# Patient Record
Sex: Male | Born: 1940 | Race: White | Hispanic: No | Marital: Married | Smoking: Never smoker
Health system: Southern US, Community
[De-identification: ages and names within clinical notes are randomized; demographics above are authoritative.]

## PROBLEM LIST (undated history)

## (undated) DIAGNOSIS — E119 Type 2 diabetes mellitus without complications: Secondary | ICD-10-CM

## (undated) DIAGNOSIS — I10 Essential (primary) hypertension: Secondary | ICD-10-CM

## (undated) DIAGNOSIS — I69354 Hemiplegia and hemiparesis following cerebral infarction affecting left non-dominant side: Secondary | ICD-10-CM

## (undated) DIAGNOSIS — S065X9A Traumatic subdural hemorrhage with loss of consciousness of unspecified duration, initial encounter: Secondary | ICD-10-CM

## (undated) DIAGNOSIS — I639 Cerebral infarction, unspecified: Secondary | ICD-10-CM

## (undated) DIAGNOSIS — R5381 Other malaise: Secondary | ICD-10-CM

## (undated) DIAGNOSIS — I96 Gangrene, not elsewhere classified: Secondary | ICD-10-CM

## (undated) DIAGNOSIS — I739 Peripheral vascular disease, unspecified: Secondary | ICD-10-CM

## (undated) DIAGNOSIS — K5900 Constipation, unspecified: Secondary | ICD-10-CM

## (undated) DIAGNOSIS — IMO0002 Reserved for concepts with insufficient information to code with codable children: Secondary | ICD-10-CM

## (undated) DIAGNOSIS — F0390 Unspecified dementia without behavioral disturbance: Secondary | ICD-10-CM

## (undated) DIAGNOSIS — R296 Repeated falls: Secondary | ICD-10-CM

## (undated) DIAGNOSIS — S42002A Fracture of unspecified part of left clavicle, initial encounter for closed fracture: Secondary | ICD-10-CM

## (undated) DIAGNOSIS — S065XAA Traumatic subdural hemorrhage with loss of consciousness status unknown, initial encounter: Secondary | ICD-10-CM

## (undated) DIAGNOSIS — I69391 Dysphagia following cerebral infarction: Secondary | ICD-10-CM

## (undated) DIAGNOSIS — E860 Dehydration: Secondary | ICD-10-CM

## (undated) HISTORY — DX: Traumatic subdural hemorrhage with loss of consciousness of unspecified duration, initial encounter: S06.5X9A

## (undated) HISTORY — DX: Fracture of unspecified part of left clavicle, initial encounter for closed fracture: S42.002A

## (undated) HISTORY — DX: Essential (primary) hypertension: I10

## (undated) HISTORY — DX: Hemiplegia and hemiparesis following cerebral infarction affecting left non-dominant side: I69.354

## (undated) HISTORY — DX: Cerebral infarction, unspecified: I63.9

## (undated) HISTORY — DX: Gangrene, not elsewhere classified: I96

## (undated) HISTORY — DX: Repeated falls: R29.6

## (undated) HISTORY — DX: Peripheral vascular disease, unspecified: I73.9

## (undated) HISTORY — DX: Type 2 diabetes mellitus without complications: E11.9

## (undated) HISTORY — DX: Dehydration: E86.0

## (undated) HISTORY — DX: Other malaise: R53.81

## (undated) HISTORY — DX: Dysphagia following cerebral infarction: I69.391

## (undated) HISTORY — DX: Constipation, unspecified: K59.00

## (undated) HISTORY — DX: Reserved for concepts with insufficient information to code with codable children: IMO0002

## (undated) HISTORY — DX: Traumatic subdural hemorrhage with loss of consciousness status unknown, initial encounter: S06.5XAA

## (undated) HISTORY — PX: CHOLECYSTECTOMY: SHX55

## (undated) HISTORY — DX: Unspecified dementia, unspecified severity, without behavioral disturbance, psychotic disturbance, mood disturbance, and anxiety: F03.90

---

## 2005-03-17 ENCOUNTER — Ambulatory Visit: Payer: Self-pay | Admitting: Physical Medicine & Rehabilitation

## 2005-03-17 ENCOUNTER — Inpatient Hospital Stay (HOSPITAL_COMMUNITY): Admission: EM | Admit: 2005-03-17 | Discharge: 2005-03-20 | Payer: Self-pay | Admitting: Emergency Medicine

## 2005-03-20 ENCOUNTER — Inpatient Hospital Stay (HOSPITAL_COMMUNITY)
Admission: RE | Admit: 2005-03-20 | Discharge: 2005-04-28 | Payer: Self-pay | Admitting: Physical Medicine & Rehabilitation

## 2006-09-06 IMAGING — CR DG CHEST 2V
2 series · 2 of 2 positions shown · non-contrast
Comparison: none

CLINICAL DATA: Brain hemorrhage.  Fever. 
 CHEST - TWO VIEW:
 AP and lateral views with comparison films of [DATE] reveal the heart size to be normal with dilatation of the aorta. Again, there is mild loss of volume in the bases without active findings.

[view not recorded (1 of 2)]
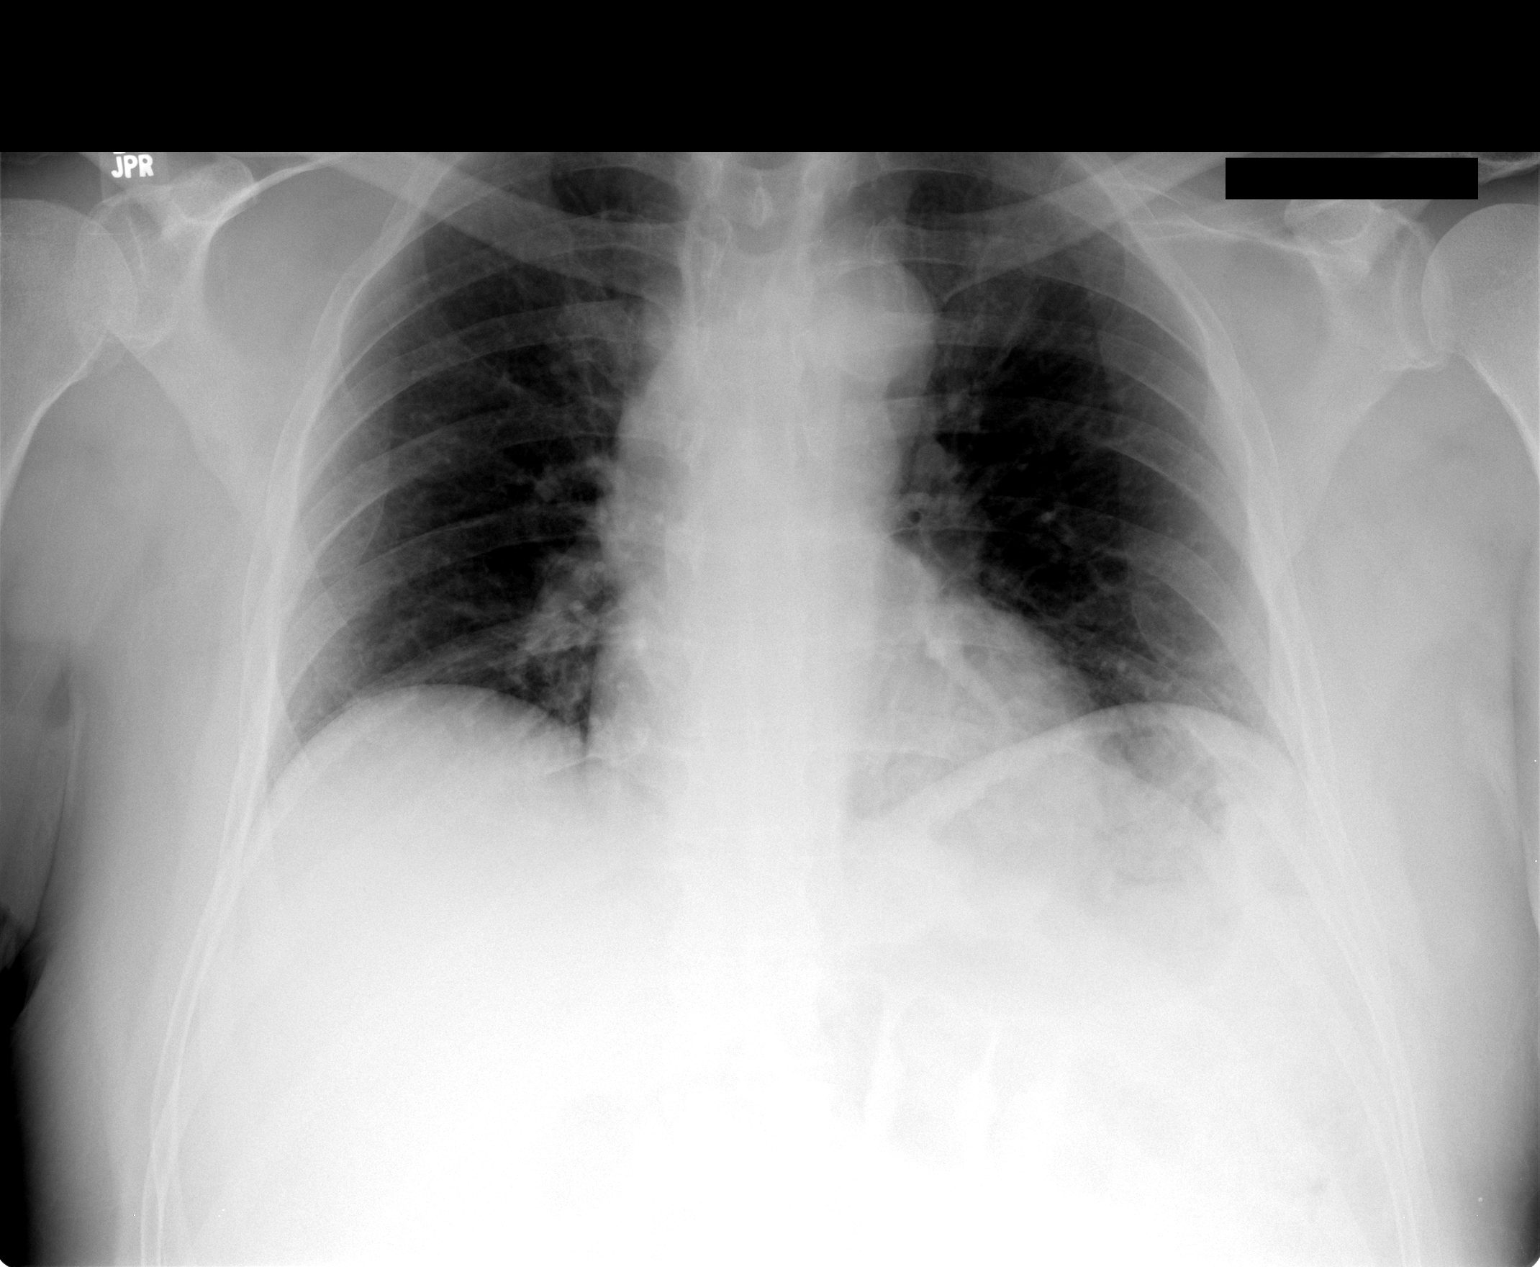

[view not recorded (2 of 2)]
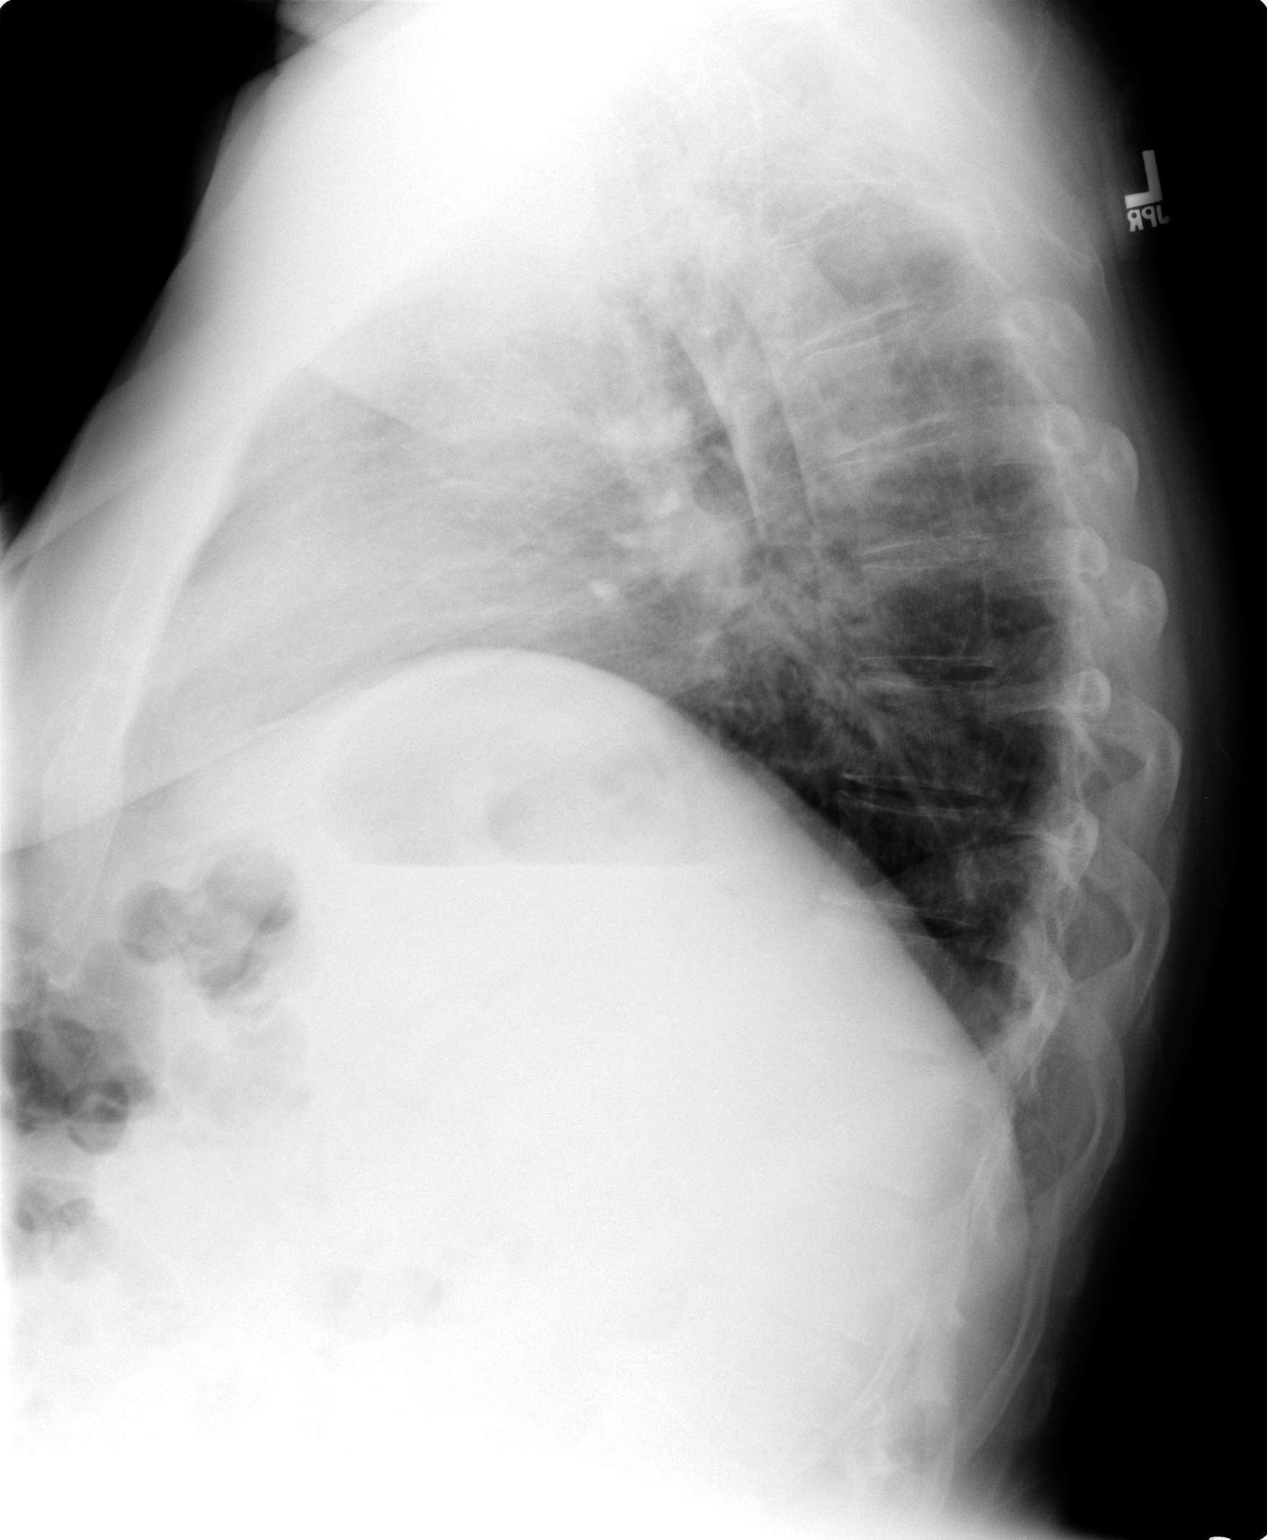

[2 of 2 positions shown; findings below may reference images not displayed]

IMPRESSION: No active disease.

## 2013-12-15 ENCOUNTER — Non-Acute Institutional Stay (SKILLED_NURSING_FACILITY): Payer: Medicare Other | Admitting: Adult Health

## 2013-12-15 ENCOUNTER — Encounter: Payer: Self-pay | Admitting: Adult Health

## 2013-12-15 DIAGNOSIS — I739 Peripheral vascular disease, unspecified: Secondary | ICD-10-CM

## 2013-12-15 DIAGNOSIS — Z5189 Encounter for other specified aftercare: Secondary | ICD-10-CM

## 2013-12-15 DIAGNOSIS — I10 Essential (primary) hypertension: Secondary | ICD-10-CM

## 2013-12-15 DIAGNOSIS — S065X9A Traumatic subdural hemorrhage with loss of consciousness of unspecified duration, initial encounter: Secondary | ICD-10-CM | POA: Insufficient documentation

## 2013-12-15 DIAGNOSIS — E1159 Type 2 diabetes mellitus with other circulatory complications: Secondary | ICD-10-CM | POA: Insufficient documentation

## 2013-12-15 DIAGNOSIS — I69354 Hemiplegia and hemiparesis following cerebral infarction affecting left non-dominant side: Secondary | ICD-10-CM | POA: Insufficient documentation

## 2013-12-15 DIAGNOSIS — I69959 Hemiplegia and hemiparesis following unspecified cerebrovascular disease affecting unspecified side: Secondary | ICD-10-CM

## 2013-12-15 DIAGNOSIS — G8929 Other chronic pain: Secondary | ICD-10-CM | POA: Insufficient documentation

## 2013-12-15 DIAGNOSIS — I96 Gangrene, not elsewhere classified: Secondary | ICD-10-CM | POA: Insufficient documentation

## 2013-12-15 MED ORDER — ACETAMINOPHEN 500 MG PO TABS: 1000.0000 mg | ORAL_TABLET | Freq: Two times a day (BID) | ORAL | Status: DC

## 2013-12-15 NOTE — Progress Notes (Signed)
Patient ID: Jack Ferguson, male   DOB: 1941/02/15, 72 y.o.   MRN: [id number]     [facility]  No Known Allergies  Chief Complaint  Patient presents with  . Hospitalization Follow-up    HPI Prior to his initial hospitalization in Nov 2014 he was living at home with his daughter with minimal asistance. He suffered a fall with a 3 cm subdural hematoma he was hospitalized had confusion and was taken to another skilled setting for rehab services. On Dec 5th he was discharged to home and fell within moments of being home causing a left clavicle fracture. He was not hospitalized at that time. Upon his MD follow up visit he was found to have not one but 2 toes with gangrene; severe constipation and his clavicle fracture. His pcp felt is was necessary for him to hospitalized at that time. Upon his admission to the hospital a repeat ct scan was performed and per his daughter he was found to have a second subdural hematoma which was felt to be 58-84 weeks old; which was not related to the known 2 falls.  He is at this facility for short term rehab; his goal is to return back home with his daughter at this time.  Today he is more confused did not eat either breakfast or lunch; he is more confused his speech is somewhat more slurred. He is having pain with movement. He cannot fully participate in the hpi or ros at this time.   Past Medical History  Diagnosis Date  . Gangrene of toe     right great toe and left 2nd toe  . Subdural hematoma, post-traumatic   . Hypertension   . PVD (peripheral vascular disease)   . Diabetes mellitus without complication   . Multiple falls   . Physical debility   . Constipation   . Stroke   . Hemiparesis affecting left side as late effect of cerebrovascular accident   . Fracture of left clavicle     Past Surgical History  Procedure Laterality Date  . Cholecystectomy      Filed Vitals:   12/15/13 1441  BP: 140/80  Pulse: 90  Height: 6\' 3"  (1.905 m)  Weight: 160  lb (72.576 kg)   MEDICATIONS  norvasc 5 mg daily  mvi daily Metformin 500 mg daily Fish oil 1gm daily miralax daily prn    SIGNIFICANT STUDIES:  11/2013: left foot x-ray: no evidence of osteomyelitis 11/2103: right foot x-ray: suspicious for osteomyelitis at the distal phalanx of the great toe 11/2013: left clavicle: non displace fracture 11/2013: ct of head: left sided subdural hematoma   LABS REVIEWED:  11/2013: glucose 139; bun 23; creat 0.77   Review of Systems  Unable to perform ROS    Physical Exam  Constitutional: No distress.  frail  Neck: Neck supple. No JVD present.  Cardiovascular: Normal rate and regular rhythm.   Pedal pulses very faint  Respiratory: Effort normal and breath sounds normal. No respiratory distress.  GI: Soft. Bowel sounds are normal. He exhibits no distension. There is no tenderness.  Musculoskeletal: He exhibits no edema.  Has lower extremity contractures present. Left hand is a splint; has full range of motion in right arm   Neurological: He is alert.  Speech is slurred; is aware of his surroundings. Does recognized others   Skin: Skin is warm and dry. He is not diaphoretic.  Right great toe and left 2nd toe black due to gangrene     ASSESSMENT/PLAN  1. Cva: no change in status; he is not a candidate for anticoagulation due to his subdural hematomas. Will continue therapy as directed and will continue to monitor his status  2. Subdural hematoma: no change in status; will continue to monitor her status  3. Left clavicle fracture: will set up an orthopedic consult and will monitor his status  4. Hypertension: will continue norvasc 5 mg daily and will monitor  5. Diabetes: will continue metformin 500 gm daily and daily cbg's will check hgb a1c  6. Poor  fluid intake: will begin NS at 75 cc per hour for 2 liters; in the am will check a stat cbc and cmp and will monitor his status will have him out of bed for meals in order to promote  his meal intake   7. Chronic pain: will begin tylenol 1 gm twice daily and will monitor   Time spent with patient 50 minutes

## 2013-12-16 ENCOUNTER — Non-Acute Institutional Stay (SKILLED_NURSING_FACILITY): Payer: Medicare Other | Admitting: Internal Medicine

## 2013-12-16 DIAGNOSIS — I70269 Atherosclerosis of native arteries of extremities with gangrene, unspecified extremity: Secondary | ICD-10-CM

## 2013-12-16 DIAGNOSIS — E1159 Type 2 diabetes mellitus with other circulatory complications: Secondary | ICD-10-CM

## 2013-12-16 DIAGNOSIS — S065X9A Traumatic subdural hemorrhage with loss of consciousness of unspecified duration, initial encounter: Secondary | ICD-10-CM

## 2013-12-16 DIAGNOSIS — E860 Dehydration: Secondary | ICD-10-CM

## 2013-12-16 DIAGNOSIS — I6200 Nontraumatic subdural hemorrhage, unspecified: Secondary | ICD-10-CM

## 2013-12-16 NOTE — Progress Notes (Signed)
Patient ID: Jack Ferguson, male   DOB: 05-Oct-1941, 72 y.o.   MRN: [id number]  Facility; [facility] Chief complaint; admission to SNF post admit to [facility] from December 11 to December 16  History; this is a severely disabled 72 year old man. He has a past history of a remote CVA with residual dense left hemiplegia.Marland Kitchen He has a history of multiple falls out of his wheelchair and was in hospital on November 1 secondary to a fall out of a wheelchair at which time he suffered a dense left frontal intraparenchymal hemorrhage. He was admitted on December of 11 secondary to bilateral dry gangrene in his feet his most recent CT scan confirms the presence of a left subdural hematoma with mass effect and shift. He was seen by neurosurgery who did not feel that he required acute neurosurgical intervention. Serial CT scans over the "next several months" were also suggested  On this occasion he apparently presented to hospital with gangrene of his toes bilaterally this includes Dr. Carnella Guadalajara gangrene of the right first toe. There was also dry gangrene with impending autoamputation of the left second toe. X-ray of the right first toe was suspicious for osteomyelitis. Amputation of the toes were suggested at some point however evaluation of the subdural hematoma seems to preclude this. He received IV Teflaro. But has not been started on any further antibiotics.  He was already found to be clinically dehydrated in the facility and has been on IV normal saline at 75 cc an hour overnight  Past Medical History  Diagnosis Date  . Gangrene of toe     right great toe and left 2nd toe  . Subdural hematoma, post-traumatic   . Hypertension   . PVD (peripheral vascular disease)   . Diabetes mellitus   . Multiple falls   . Physical debility   . Constipation   . Stroke   . Hemiparesis affecting left side as late effect of cerebrovascular accident   . Fracture of left clavicle    Past Surgical History   Procedure Laterality Date  . Cholecystectomy     Medication; amlodipine 5 mg a day, calcium carbonate 500 daily, fish oil at thousand daily, metformin 500 mg orally daily, MiraLax 17 g daily multivitamin  Social history; he has full code status. He was apparently at a nursing home in [place] before he came here. As noted had multiple falls out of his wheelchair  Family history not available from any current source   Review of systems; not possible  Physical examination Gen. very disabled man with bilateral lower extremity flexion contractures left arm flexion contracture. Older from gangrenous wounds in his feet Oral examination very difficult Forward appeared he had unswallowed food rather than thrush noted Lymph nonpalpable in the cervical chain. No thyroid palpable Respiratory clear entry bilaterally Cardiac heart sounds are tachycardic in the 110-120 range O2 sat 97% on room air Abdomen; no liver no spleen no tenderness GU bladder is not overtly distended or tender there is no CVA tenderness Extremities peripheral pulses are absent gangrenous left second toe and right first toe. His legs are contracture; he flexes them further due to pain probbly a combination of the fractures and his gangrene Neurologic; there is severe bihemispheric damage here. He seems able to respond to some questions including stating that he was 72 years old and that he was from [place]. Other times she just seemed to imitate what I was saying to him  Skin there  is an area on the left medial ankle which is probably an impending ischemic wounds  Impression/plan #1 bihemispheric damage apparently complicated by a recent large subdural hematoma. I have reviewed an MRI from the [facility] system in 2006 at which time he had a right thalamic intracerebral hemorrhage. 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 This man is having pain at rest. Apparently his responsible party will not allow more aggressive pain medication #3 dehydration; I agree is somewhat dehydrated at the bedside lab work is pending  #4 dry gangrene as described. I don't see any overt soft tissue extension of infection however he is at risk of this. She really requires bilateral probably at least below-knee amputations perhaps above-knee amputations.  It has already been est. That he is not a candidate for revascularization according to the notes that came with. He is probably already developing another wound over the left ankle. He is not an easy candidate for plavix secondary to the recent large subdural hematoma.  This is going to be a progressively difficult situation I think complicated by failure to thrive, declining oral intake secondary to bihemispheric damage and pain. I will late lab work from today. He has extreme physical and cognitive disability.

## 2014-01-16 ENCOUNTER — Non-Acute Institutional Stay (SKILLED_NURSING_FACILITY): Payer: Medicare Other | Admitting: Internal Medicine

## 2014-01-16 DIAGNOSIS — S065X9A Traumatic subdural hemorrhage with loss of consciousness of unspecified duration, initial encounter: Secondary | ICD-10-CM

## 2014-01-16 DIAGNOSIS — I739 Peripheral vascular disease, unspecified: Secondary | ICD-10-CM

## 2014-01-16 DIAGNOSIS — I10 Essential (primary) hypertension: Secondary | ICD-10-CM

## 2014-01-16 DIAGNOSIS — S065XAA Traumatic subdural hemorrhage with loss of consciousness status unknown, initial encounter: Secondary | ICD-10-CM

## 2014-01-16 DIAGNOSIS — I6200 Nontraumatic subdural hemorrhage, unspecified: Secondary | ICD-10-CM

## 2014-01-16 DIAGNOSIS — R1310 Dysphagia, unspecified: Secondary | ICD-10-CM

## 2014-01-19 ENCOUNTER — Encounter: Payer: Self-pay | Admitting: Family

## 2014-01-19 ENCOUNTER — Non-Acute Institutional Stay (SKILLED_NURSING_FACILITY): Payer: Medicare Other | Admitting: Family

## 2014-01-19 DIAGNOSIS — S065X9A Traumatic subdural hemorrhage with loss of consciousness of unspecified duration, initial encounter: Secondary | ICD-10-CM

## 2014-01-19 DIAGNOSIS — S065XAA Traumatic subdural hemorrhage with loss of consciousness status unknown, initial encounter: Secondary | ICD-10-CM

## 2014-01-19 DIAGNOSIS — E871 Hypo-osmolality and hyponatremia: Secondary | ICD-10-CM

## 2014-01-19 DIAGNOSIS — E1159 Type 2 diabetes mellitus with other circulatory complications: Secondary | ICD-10-CM

## 2014-01-19 DIAGNOSIS — I6200 Nontraumatic subdural hemorrhage, unspecified: Secondary | ICD-10-CM

## 2014-01-19 DIAGNOSIS — I69354 Hemiplegia and hemiparesis following cerebral infarction affecting left non-dominant side: Secondary | ICD-10-CM

## 2014-01-19 DIAGNOSIS — I69959 Hemiplegia and hemiparesis following unspecified cerebrovascular disease affecting unspecified side: Secondary | ICD-10-CM

## 2014-01-19 NOTE — Progress Notes (Signed)
   Patient ID: Jack Ferguson, male   DOB: 12/06/1941, 73 y.o.   MRN: [id number]  Date: 01/19/14 [facility]  Chief Complaint  Patient presents with  . Acute Visit    Abnormal Labs    HPI: Request form nurse to follow up with pt d/t abnormal lab values.  Health care team endorses inability to place condom cath on pt to monitor output.  No further issues/concerns expressed at present time.     No Known Allergies   Medication List       This list is accurate as of: 01/19/14 11:59 PM.  Always use your most recent med list.               acetaminophen 500 MG tablet  Commonly known as:  TYLENOL  Take 2 tablets (1,000 mg total) by mouth 2 (two) times daily.     amLODipine 5 MG tablet  Commonly known as:  NORVASC  Take 5 mg by mouth daily.     beta carotene w/minerals tablet  Take 1 tablet by mouth daily.     calcium carbonate 500 MG chewable tablet  Commonly known as:  TUMS - dosed in mg elemental calcium  Chew 1 tablet by mouth daily.     Fish Oil 1000 MG Caps  Take 1,000 mg by mouth daily.     metFORMIN 500 MG tablet  Commonly known as:  GLUCOPHAGE  Take 500 mg by mouth daily with breakfast.     polyethylene glycol packet  Commonly known as:  MIRALAX / GLYCOLAX  Take 17 g by mouth daily as needed.         DATA REVIEWED  Laboratory Studies: 01/17/14- Na 130, K 4.9, Cl 92, BUN 19, Creat. 0.5     Past Medical History  Diagnosis Date  . Gangrene of toe     right great toe and left 2nd toe  . Subdural hematoma, post-traumatic   . Hypertension   . PVD (peripheral vascular disease)   . Diabetes mellitus without complication   . Multiple falls   . Physical debility   . Constipation   . Stroke   . Hemiparesis affecting left side as late effect of cerebrovascular accident   . Fracture of left clavicle    Past Surgical History  Procedure Laterality Date  . Cholecystectomy      Review of Systems  Unable to perform ROS: medical condition      Physical Exam Filed Vitals:   01/19/14 1619  BP: 120/74  Pulse: 92  Temp: 98.6 F (37 C)  Resp: 20   There is no weight on file to calculate BMI. Physical Exam  Constitutional: He appears lethargic.  Cardiovascular: Normal rate and regular rhythm.   Pulmonary/Chest: Effort normal and breath sounds normal.  Abdominal:  Peg tube-receiving TF and H20 flushes  Neurological: He appears lethargic.  Unable to fully assess; pt moan/groans unable to effectively communicate    ASSESSMENT/PLAN NS a 75 cc/hour 12 hours Keep condom cath in place Measure I/O Repeat BMP next draw  Follow up:prn

## 2014-01-20 ENCOUNTER — Non-Acute Institutional Stay (SKILLED_NURSING_FACILITY): Payer: Medicare Other | Admitting: Internal Medicine

## 2014-01-20 DIAGNOSIS — S065XAA Traumatic subdural hemorrhage with loss of consciousness status unknown, initial encounter: Secondary | ICD-10-CM

## 2014-01-20 DIAGNOSIS — I6200 Nontraumatic subdural hemorrhage, unspecified: Secondary | ICD-10-CM

## 2014-01-20 DIAGNOSIS — I70269 Atherosclerosis of native arteries of extremities with gangrene, unspecified extremity: Secondary | ICD-10-CM

## 2014-01-20 DIAGNOSIS — S065X9A Traumatic subdural hemorrhage with loss of consciousness of unspecified duration, initial encounter: Secondary | ICD-10-CM

## 2014-01-20 NOTE — Progress Notes (Signed)
Patient ID: Jack Ferguson, male   DOB: 04/24/1941, 73 y.o.   MRN: [id number] [facility];; Chief complaint; review status post hospitalization at [facility] 12/23 through 1/16 History; this is a man that I admitted to the facility on 12/19 coming from [facility]. At that point in time he had bihemispheric damage complicated by a recent large subdural hematoma per his chronic left hemiparesis secondary to a remote right thalamic intracerebral hemorrhage. He also had dry gangrene and was already receiving IV fluids for progressive dehydration he presented to the hospital with altered mental status and was found that the subdural hematoma out of bed to a now chronic left-sided subdural with associated mass effect the. The patient was taken to the operating room after correction of a infection of unknown etiology possibly aspiration pneumonia. He ultimately really required a PEG tube  Physical examination Gen. the patient does not look particularly comfortable screams when you move him of all especially his lower extremities Respiratory shallow but otherwise clear entry Cardiac once again appears to be somewhat volume contracted Extremites; tight flexion contractures with progressive mummification of his right first and left second toe  Impression/plan #1 now status post Burhole of a large left subdural. Nevertheless the patient is left with severe cognitive and physical deficits #2 severe peripheral vascular disease with likely chronic pain.  #3 I will check his pacing metabolic panel next week for lab work didn't done in the facility did not suggest dehydration on 1/16

## 2014-01-23 ENCOUNTER — Encounter: Payer: Self-pay | Admitting: Family

## 2014-01-27 DIAGNOSIS — S065XAA Traumatic subdural hemorrhage with loss of consciousness status unknown, initial encounter: Secondary | ICD-10-CM | POA: Insufficient documentation

## 2014-01-27 DIAGNOSIS — R1310 Dysphagia, unspecified: Secondary | ICD-10-CM | POA: Insufficient documentation

## 2014-01-27 DIAGNOSIS — S065X9A Traumatic subdural hemorrhage with loss of consciousness of unspecified duration, initial encounter: Secondary | ICD-10-CM | POA: Insufficient documentation

## 2014-01-27 NOTE — Progress Notes (Signed)
        HISTORY & PHYSICAL  DATE: 01/16/2014   FACILITY[facility]  LEVEL OF CARE: SNF (31)  ALLERGIES:  No Known Allergies  CHIEF COMPLAINT:  Manage subdural hematoma, peripheral vascular disease and hypertension  HISTORY OF PRESENT ILLNESS: Patient is a 73 year old Caucasian male who was hospitalized with altered mental status. After hospitalization he is readmitted back to the facility for long-term care management.  SDH: Due to altered mental status he had a head CT done which showed increase in subcutaneous hematoma. He had a midline shift from left to right of his brain. He underwent drainage of the subdural hematoma with bur holes. Patient does not follow commands.  PVD: The patient was phoned in to have dry gangrene of his feet. Patient was deemed not a surgical candidate and conservative management was recommended.  HTN: Pt 's HTN remains stable.  Staff Deny CP, sob, DOE, pedal edema, headaches, dizziness or visual disturbances.  No complications from the medications currently being used.  Last BP : 150/81  PAST MEDICAL HISTORY :  Past Medical History  Diagnosis Date  . Gangrene of toe     right great toe and left 2nd toe  . Subdural hematoma, post-traumatic   . Hypertension   . PVD (peripheral vascular disease)   . Diabetes mellitus without complication   . Multiple falls   . Physical debility   . Constipation   . Stroke   . Hemiparesis affecting left side as late effect of cerebrovascular accident   . Fracture of left clavicle     PAST SURGICAL HISTORY: Past Surgical History  Procedure Laterality Date  . Cholecystectomy      SOCIAL HISTORY: Per record  reports that he has never smoked. He does not have any smokeless tobacco history on file.  FAMILY HISTORY: None Per record  CURRENT MEDICATIONS: [facility]  REVIEW OF SYSTEMS:  Unobtainable due to the patient being a poor historian  PHYSICAL EXAMINATION  VS:  T  98.6        P 92      RR 20       BP 152/81        WT (Lb) 152.2  GENERAL: no acute distress, normal body habitus EYES: conjunctivae normal, sclerae normal, normal eye lids MOUTH/THROAT: Unable to assess NECK: supple, trachea midline, no neck masses, no thyroid tenderness, no thyromegaly LYMPHATICS: no LAN in the neck, no supraclavicular LAN RESPIRATORY: breathing is even & unlabored, BS decreased CARDIAC: RRR, no murmur,no extra heart sounds, no edema GI:  ABDOMEN: abdomen soft, normal BS, no masses, no tenderness  LIVER/SPLEEN: no hepatomegaly, no splenomegaly MUSCULOSKELETAL: EXTREMITIES: Unable to assess, gangrene in left second toe and right great toe PSYCHIATRIC: the patient is alert & disoriented, decreased affect & behavior appropriate  LABS/RADIOLOGY:  12-14 CT of the abdomen: Fecal impaction, blood cultures showed no growth, urine culture negative, wbc 13, hemoglobin 12.2, platelets 342, right foot x-ray: No osteomyelitis  ASSESSMENT/PLAN:  Subdural hematoma-status post drainage. Continue supportive care PVD-patient has dry gangrene in bilateral feet. Consented to management recommended. Hypertension-blood pressure is elevated. Will monitor. Dysphagia-status post PEG tube placement Moderate protein calorie malnutrition-now on tube feedings Diabetes mellitus-continue metformin Check CBC and BMP  I have reviewed patient's medical records received at admission/from hospitalization.  CPT CODE: [id number]

## 2014-01-30 ENCOUNTER — Non-Acute Institutional Stay (SKILLED_NURSING_FACILITY): Payer: Medicare Other | Admitting: Internal Medicine

## 2014-01-30 DIAGNOSIS — N390 Urinary tract infection, site not specified: Secondary | ICD-10-CM

## 2014-01-30 NOTE — Progress Notes (Signed)
         PROGRESS NOTE  DATE: 01/30/2014  FACILITY:  [facility]  LEVEL OF CARE: SNF (31)  Acute Visit  CHIEF COMPLAINT:  Manage UTI  HISTORY OF PRESENT ILLNESS: I was requested by the staff to assess the patient regarding above problem(s):  On 01-23-14 patient's urinalysis shows positive nitrite, small leukosite esterase, small blood, WBC 7-10 and RBC 0-2. Urine culture grows pseudomonas aeruginosa significantly. Patient is a poor historian.  PAST MEDICAL HISTORY : Reviewed.  No changes.  CURRENT MEDICATIONS: [facility]  REVIEW OF SYSTEMS: Unobtainable due to patient being a poor historian  PHYSICAL EXAMINATION  GENERAL: no acute distress, normal body habitus RESPIRATORY: breathing is even & unlabored, BS CTAB CARDIAC: RRR, no murmur,no extra heart sounds, no edema GI: abdomen soft, normal BS, no masses, no tenderness, no hepatomegaly, no splenomegaly PSYCHIATRIC: the patient is alert & disoriented, affect depressed & behavior appropriate  LABS/RADIOLOGY: See history of present illness  ASSESSMENT/PLAN:  UTI-new problem. Start Cipro 500 mg twice a day for one week and probiotics twice a day for 10 days.  CPT CODE: [id number]

## 2014-01-31 ENCOUNTER — Non-Acute Institutional Stay (SKILLED_NURSING_FACILITY): Payer: Medicare Other | Admitting: Family

## 2014-01-31 ENCOUNTER — Encounter: Payer: Self-pay | Admitting: Family

## 2014-01-31 DIAGNOSIS — S065X9A Traumatic subdural hemorrhage with loss of consciousness of unspecified duration, initial encounter: Secondary | ICD-10-CM

## 2014-01-31 DIAGNOSIS — E871 Hypo-osmolality and hyponatremia: Secondary | ICD-10-CM

## 2014-01-31 DIAGNOSIS — I96 Gangrene, not elsewhere classified: Secondary | ICD-10-CM

## 2014-01-31 DIAGNOSIS — I69959 Hemiplegia and hemiparesis following unspecified cerebrovascular disease affecting unspecified side: Secondary | ICD-10-CM

## 2014-01-31 DIAGNOSIS — I69354 Hemiplegia and hemiparesis following cerebral infarction affecting left non-dominant side: Secondary | ICD-10-CM

## 2014-01-31 DIAGNOSIS — I6200 Nontraumatic subdural hemorrhage, unspecified: Secondary | ICD-10-CM

## 2014-01-31 DIAGNOSIS — S065XAA Traumatic subdural hemorrhage with loss of consciousness status unknown, initial encounter: Secondary | ICD-10-CM

## 2014-01-31 NOTE — Progress Notes (Signed)
Patient ID: Jack Ferguson, male   DOB: 01/31/1941, 73 y.o.   MRN: [id number]  Date: 01/31/14 [facility]  Code Status:  DNR  Chief Complaint  Patient presents with  . Acute Visit    Anxiety    HPI: Nurse notified NP of pt growing anxiety manifested by screaming for "Jack Ferguson" and interrupting with health care.  Pt reports feeling restless and down since placed in facility.  Pt reports calling for daughter, Jack Ferguson/Jack Ferguson, throughout the day.  Pt denies chest pain, palpitations, N/V/D, fever, chills; however, endorses sacral discomfort and foot pain.  Pt further endorses unwillingness to take pain medication. Pt reports positioning helps relieve pain and discomfort.  Pt endorses willingness to get out of bed and become more active.  No further issues/concerns at present time      No Known Allergies    Medication List       This list is accurate as of: 01/31/14  2:44 PM.  Always use your most recent med list.               acetaminophen 500 MG tablet  Commonly known as:  TYLENOL  Take 2 tablets (1,000 mg total) by mouth 2 (two) times daily.     amLODipine 5 MG tablet  Commonly known as:  NORVASC  Take 5 mg by mouth daily.     beta carotene w/minerals tablet  Take 1 tablet by mouth daily.     calcium carbonate 500 MG chewable tablet  Commonly known as:  TUMS - dosed in mg elemental calcium  Chew 1 tablet by mouth daily.     Fish Oil 1000 MG Caps  Take 1,000 mg by mouth daily.     metFORMIN 500 MG tablet  Commonly known as:  GLUCOPHAGE  Take 500 mg by mouth daily with breakfast.     polyethylene glycol packet  Commonly known as:  MIRALAX / GLYCOLAX  Take 17 g by mouth daily as needed.         DATA REVIEWED  Laboratory Studies: Reviewed     Past Medical History  Diagnosis Date  . Gangrene of toe     right great toe and left 2nd toe  . Subdural hematoma, post-traumatic   . Hypertension   . PVD (peripheral vascular disease)   . Diabetes mellitus  without complication   . Multiple falls   . Physical debility   . Constipation   . Stroke   . Hemiparesis affecting left side as late effect of cerebrovascular accident   . Fracture of left clavicle      Past Surgical History  Procedure Laterality Date  . Cholecystectomy      Review of Systems  Constitutional: Negative.   Respiratory: Negative.   Cardiovascular: Negative.   Gastrointestinal:       Peg infusing TF  Genitourinary:       Incontinent  Musculoskeletal:       Sacral Pain/Discomfort  Psychiatric/Behavioral: Positive for depression. Negative for suicidal ideas. The patient is nervous/anxious.      Physical Exam Filed Vitals:   01/31/14 1430  BP: 110/62  Pulse: 96  Temp: 97.5 F (36.4 C)  Resp: 20   There is no weight on file to calculate BMI. Physical Exam  Constitutional: He is oriented to person, place, and time.  Cardiovascular: Normal rate and regular rhythm.   Pulmonary/Chest: Effort normal and breath sounds normal.  Musculoskeletal:  Contractures to BLE-foot drop boot to BLE  Neurological: He  is alert and oriented to person, place, and time.  Intermittent confusion/restlessness noted/ L sided hemiparesis    ASSESSMENT/PLAN  Psych Eval to assess depression/ anxiety BMP on next draw Chlorhexidine 0.12% mouthwash bid po 15 ml  PT/OT eval/treat Treatment nurse place colloid dsg for prophylaxis   Follow up:prn

## 2014-02-06 ENCOUNTER — Non-Acute Institutional Stay (SKILLED_NURSING_FACILITY): Payer: Medicare Other | Admitting: Internal Medicine

## 2014-02-06 DIAGNOSIS — I10 Essential (primary) hypertension: Secondary | ICD-10-CM

## 2014-02-06 DIAGNOSIS — E1059 Type 1 diabetes mellitus with other circulatory complications: Secondary | ICD-10-CM

## 2014-02-06 DIAGNOSIS — I739 Peripheral vascular disease, unspecified: Secondary | ICD-10-CM

## 2014-02-06 DIAGNOSIS — I6990 Unspecified sequelae of unspecified cerebrovascular disease: Secondary | ICD-10-CM

## 2014-02-11 DIAGNOSIS — I6990 Unspecified sequelae of unspecified cerebrovascular disease: Secondary | ICD-10-CM | POA: Insufficient documentation

## 2014-02-11 DIAGNOSIS — E1059 Type 1 diabetes mellitus with other circulatory complications: Secondary | ICD-10-CM | POA: Insufficient documentation

## 2014-02-11 DIAGNOSIS — E1051 Type 1 diabetes mellitus with diabetic peripheral angiopathy without gangrene: Secondary | ICD-10-CM | POA: Insufficient documentation

## 2014-02-11 NOTE — Progress Notes (Signed)
        PROGRESS NOTE  DATE: 02/06/2014   FACILITY: [facility]  LEVEL OF CARE: SNF (31)  Discharge Visit  CHIEF COMPLAINT:  Manage CVA and hypertension  HISTORY OF PRESENT ILLNESS: I was requested by the social worker to perform face-to-face evaluation for discharge:  Patient was admitted to this facility for short-term rehabilitation after the patient's recent hospitalization.  Patient has completed SNF rehabilitation and therapy has cleared the patient for discharge on 01-30-14.  Reassessment of ongoing problem(s):  CVA: The patient's CVA remains stable.  Patient denies new neurologic symptoms such as numbness, tingling, weakness, speech difficulties or visual disturbances.  No complications reported from the medications currently being used. Patient has left-sided hemiparesis.  HTN: Pt 's HTN remains stable.  Denies CP, sob, DOE, pedal edema, headaches, dizziness or visual disturbances.  No complications from the medications currently being used.  Last BP : 101/75.  PAST MEDICAL HISTORY : Reviewed.  No changes.  [facility]: [facility]  REVIEW OF SYSTEMS:  GENERAL: no change in appetite, no fatigue, no weight changes, no fever, chills or weakness RESPIRATORY: no cough, SOB, DOE, wheezing, hemoptysis CARDIAC: no chest pain, edema or palpitations GI: no abdominal pain, diarrhea, constipation, heart burn, nausea or vomiting  PHYSICAL EXAMINATION  VS:  T 98.2       P 81       RR 16     BP 101/ 75        WT (Lb) 146.2  GENERAL: no acute distress, normal body habitus EYES: conjunctivae normal, sclerae normal, normal eye lids NECK: supple, trachea midline, no neck masses, no thyroid tenderness, no thyromegaly LYMPHATICS: no LAN in the neck, no supraclavicular LAN RESPIRATORY: breathing is even & unlabored, BS CTAB CARDIAC: RRR, no murmur,no extra heart sounds, no edema GI: abdomen soft, normal BS, no masses, no tenderness, no  hepatomegaly, no splenomegaly, patient has peg PSYCHIATRIC: the patient is alert & oriented to person, affect & behavior appropriate  LABS/RADIOLOGY:  02-02-14 glucose 154 otherwise BMP normal  ASSESSMENT/PLAN:  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 Not a surgical candidate.  I have filled out patient's discharge paperwork and written prescriptions.  Patient will receive home health PT, OT, nursing. DME provided: hoyer lift (V58.74, 728.87, CVA)  Total discharge time: Greater than 30 minutes Discharge time involved coordination of the discharge process with Child psychotherapistsocial worker, nursing staff and therapy department. Medical justification for home health services/DME verified.  CPT CODE: [id number]

## 2014-02-16 ENCOUNTER — Encounter: Payer: Self-pay | Admitting: *Deleted

## 2014-02-27 ENCOUNTER — Non-Acute Institutional Stay (SKILLED_NURSING_FACILITY): Payer: Medicare Other | Admitting: Internal Medicine

## 2014-02-27 ENCOUNTER — Encounter: Payer: Self-pay | Admitting: Internal Medicine

## 2014-02-27 DIAGNOSIS — I69354 Hemiplegia and hemiparesis following cerebral infarction affecting left non-dominant side: Secondary | ICD-10-CM

## 2014-02-27 DIAGNOSIS — I6200 Nontraumatic subdural hemorrhage, unspecified: Secondary | ICD-10-CM

## 2014-02-27 DIAGNOSIS — I96 Gangrene, not elsewhere classified: Secondary | ICD-10-CM

## 2014-02-27 DIAGNOSIS — E1159 Type 2 diabetes mellitus with other circulatory complications: Secondary | ICD-10-CM

## 2014-02-27 DIAGNOSIS — I69959 Hemiplegia and hemiparesis following unspecified cerebrovascular disease affecting unspecified side: Secondary | ICD-10-CM

## 2014-02-27 DIAGNOSIS — I10 Essential (primary) hypertension: Secondary | ICD-10-CM

## 2014-02-27 DIAGNOSIS — S065X9A Traumatic subdural hemorrhage with loss of consciousness of unspecified duration, initial encounter: Secondary | ICD-10-CM

## 2014-02-27 DIAGNOSIS — S065XAA Traumatic subdural hemorrhage with loss of consciousness status unknown, initial encounter: Secondary | ICD-10-CM

## 2014-02-27 NOTE — Progress Notes (Signed)
Patient ID: Jack Ferguson, male   DOB: [birth year], 73 y.o.   MRN: [id number]   This is an acute visit.  Level of care skilled.  [place].  Chief complaint-acute visit status post fall requiring ER visit-medical management of chronic conditions including subdural hematoma diabetes hypertension CVA late effect.  History of present illness.  Patient is a 73 year old male with the above diagnosis most acute issue was a fall last week apparently he hit his head there was concerns with his previous history of subdural hematoma.  He did go to the ER where workup apparently was fairly benign.  CT of the head showed a subacute subdural hematoma over the left convexity  that was actually decreased in size since the previous CT  .  Spine x-rays did not show any acute process.  Chest x-ray also did not show any acute process.  It appears as vital signs has been stable-.  In regards to patient's other conditions he is listed as a diabetic blood sugar so far this month had been largely in the low mid 100s it appears that one time he had been on Glucophage but I do not see per  [facility] that he is on this any more.  Blood pressures also appear to be fairly well controlled most recently 108/73-118/74.   he is on Norvasc 10 mg a day.  Patient also has clonidine as needed he does not appear though that this is needed very often.  I do know from the lab work done in the ER on February 26 his white count was elevated at 14.6 with neutrophils elevated slightly at 77.8 absolute neutrophils 11.4-we will need to recheck this-it appears he has not been afebrile were signs of any sepsis.  I do note in late January he was treated for a UTI with Cipro he had grown out Pseudomonas.  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  Medications have been reviewed per [facility]  Review of systems-essentially unobtainable--per nursing staff there has been no increased coughing shortness of breath complaints of dysuria fever chills apparently his pain is controlled on Tylenol as needed.  Physical exam.  Temperature is 97.4 pulse 96 respirations 18 blood pressure 108/73. Weight is 144.2   general this is a frail elderly male in no distress lying in bed.   skin is warm and dry does have bandaging to his feet bilaterally I do note dry gangrene of his right great toe and left second toe I do not note any increased erythema or edema here.  Eyes pupils appear reactive to light sclera and conjunctiva are clear.  Oropharynx clear mucous membranes moist.  Chest is clear to auscultation with poor respiratory effort no labored breathing.  Heart is regular rate and rhythm without murmur gallop or rub he does not have significant lower extremity edema.  Abdomen is soft nontender positive bowel sounds PEG site appears unremarkable.  Muscle skeletal continues with left-sided hemiparesis--and diffuse contractures witht contracture right hand fingers   neurologic continues with left-sided hemiparesis he does speak although somewhat tangential and limited.  Psych is oriented to self cannot really carry on a conversation.  .      .  Labs.  02/23/2014.  WBC 14.6 hemoglobin 12.3 platelets 350 NutraFil percent  77.8.  Sodium 134 potassium 4.4 BUN 24--creatinine 0.44 -liver function tests within normal limits except albumin of 2.7.   Assessment and plan.  #1-history CVA-subdural hematoma-patient appears to be at baseline CT in the ER actually showed some improvement in the subdural hematoma clinically he appears to be stable with previous exams continue to monitor--he remains PEG tube dependent.  #2-history diabetes type 2 blood sugars appear to be fairly well controlled here  usually in the 100s he is not on any agent will update a hemoglobin A1c.  #3-hypertension this appears to be relatively well-controlled he is on Norvasc--will update metabolic panel.  #4 leukocytosis-this lab was done most a week ago he does not show any signs of UTI or respiratory issue-will update a CBC with differential tomorrow.  Peripheral vascular disease-patient continues with a history of dry gangrene to his toes right great toe and left second toe-apparently patient has refused amputation in the past and family defers to his wishes-this is followed by wound care--at this point pain appears to be controlled on current medications-essentially Tylenol as needed apparently according to nursing staff he does not require this very often  [id number]-   .    .Marland Kitchen

## 2014-03-06 ENCOUNTER — Encounter: Payer: Self-pay | Admitting: Internal Medicine

## 2014-03-06 ENCOUNTER — Non-Acute Institutional Stay (SKILLED_NURSING_FACILITY): Payer: Medicare Other | Admitting: Internal Medicine

## 2014-03-06 DIAGNOSIS — E871 Hypo-osmolality and hyponatremia: Secondary | ICD-10-CM | POA: Insufficient documentation

## 2014-03-06 DIAGNOSIS — D72829 Elevated white blood cell count, unspecified: Secondary | ICD-10-CM | POA: Insufficient documentation

## 2014-03-06 NOTE — Progress Notes (Signed)
Patient ID: Jack Ferguson, male   DOB: 08/05/1941, 73 y.o.   MRN: [id number]   This is an acute visit.  Level of care skilled.  Facility [facility] .  Chief complaint-acute visit followup leukocytosis   History of present illness.  Patient is a 73 year old male with a history of CVA with left-sided hemiparesis status post PEG tube as well secondary to dysphasia-also has a history of subdural hematoma but related to multiple falls.  Patient had a fall about a week ago and went to the ER where workup was negative actually showed some improvement of the hematoma-blood work did show a somewhat elevated white count of14.6  with increased neutrophil percentage of 77%-we ordered updated lab work for later last weekthat shows a white count had come down slightly to 13.2 granulocyte percent was 74 actually grandes elevated at 9.8.  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  [facility]   Review of systems-essentially unobtainable--per nursing staff there has been no increased coughing shortness of breath complaints of dysuria fever chills apparently his pain is controlled on Tylenol as needed--today he is denying any of this as well says he has no cough fever or chills although he is a poor historian denies dysuria or stomach pain.  Marland Kitchen.  Physical exam.  Temperature 98.2 pulse 89 respirations 16 blood pressure 125/78  general this is a frail elderly male in no distress  lying in bed.  skin is warm and dry does have bandaging to his feet bilaterally I do note dry gangrene of his right great toe and left second toe I do not note any increased erythema or edema here--this is unchanged from last week. .  Oropharynx clear mucous membranes moist.  Chest is clear to auscultation with poor respiratory effort no labored breathing.  Heart is regular rate and rhythm without murmur gallop or rub he does not have significant lower extremity edema.  Abdomen is soft nontender positive bowel sounds PEG site appears unremarkable. There is no drainage or surrounding erythema  Muscle skeletal continues with left-sided hemiparesis--and diffuse contractures witht contracture right hand fingers  neurologic continues with left-sided hemiparesis he does speak although somewhat tangential and limited.  Psych is oriented to self cannot really carry on a conversation--somewhat irritated with exam but does allow it.  .  .  Labs 03/02/2014.  WBC 13.2 hemoglobin 13.1 platelets 335 granulocyte percentage high normal at 74 actually grans elevated at 9.8.  Sodium 133 potassium 4.5 BUN 14 creatinine 0.41.  Liver function tests within normal limits except albumin of 3.2   02/23/2014.  WBC 14.6 hemoglobin 12.3 platelets 350 NeutraFil percent 77.8.  Sodium 134 potassium 4.4 BUN 24--creatinine 0.44 -liver function tests within normal limits except albumin of 2.7 .  Assessment and plan.  #1--leukocytosis-this appears to have improved somewhat from last week he's  not show any overt signs of infection--cough shortness of breath  fever or chills or congestion or dysuria-nursing staff has not noted any of this either-will update CBC with differential in 2 weeks- this again this may be coming from the reaction to the gangrenous toes which is followed by the wound care physician and apparently is stable--patient has refused amputation area  #2-hyponatremia-this appears to be relatively mild and  chronic will update this in 2 weeks as well   [id number]  .

## 2014-03-22 ENCOUNTER — Non-Acute Institutional Stay (SKILLED_NURSING_FACILITY): Payer: Medicare Other | Admitting: Internal Medicine

## 2014-03-22 DIAGNOSIS — E1159 Type 2 diabetes mellitus with other circulatory complications: Secondary | ICD-10-CM

## 2014-03-22 DIAGNOSIS — I96 Gangrene, not elsewhere classified: Secondary | ICD-10-CM

## 2014-03-22 DIAGNOSIS — D72829 Elevated white blood cell count, unspecified: Secondary | ICD-10-CM

## 2014-03-22 DIAGNOSIS — I69354 Hemiplegia and hemiparesis following cerebral infarction affecting left non-dominant side: Secondary | ICD-10-CM

## 2014-03-22 DIAGNOSIS — R1310 Dysphagia, unspecified: Secondary | ICD-10-CM

## 2014-03-22 DIAGNOSIS — I69959 Hemiplegia and hemiparesis following unspecified cerebrovascular disease affecting unspecified side: Secondary | ICD-10-CM

## 2014-03-23 ENCOUNTER — Other Ambulatory Visit: Payer: Self-pay | Admitting: *Deleted

## 2014-03-23 MED ORDER — ALPRAZOLAM 0.25 MG PO TABS: ORAL_TABLET | ORAL | Status: AC

## 2014-03-23 NOTE — Telephone Encounter (Signed)
[facility]  

## 2014-03-24 ENCOUNTER — Non-Acute Institutional Stay (SKILLED_NURSING_FACILITY): Payer: Medicare Other | Admitting: Internal Medicine

## 2014-03-24 DIAGNOSIS — I96 Gangrene, not elsewhere classified: Secondary | ICD-10-CM

## 2014-03-24 DIAGNOSIS — D72829 Elevated white blood cell count, unspecified: Secondary | ICD-10-CM

## 2014-03-24 NOTE — Progress Notes (Signed)
Patient ID: Jack Ferguson, male   DOB: 03-19-1941, 73 y.o.   MRN: [id number]   This is an acute visit.  Level of care skilled.  Facility [facility]  .  Chief complaint-acute visit followup leukocytosis  History of present illness.  Patient is a 73 year old male with a history of CVA with left-sided hemiparesis status post PEG tube as well secondary to dysphasia-also has a history of subdural hematoma but related to multiple falls.     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 this is followed by wound care apparently he has refused amputation in the past-- Patient recently was in the hospital for PEG site complications and actually his PEG tube was removed he is now taking everything by mouth-I do not he had an elevated white count in the hospital -- he was seen by infectious disease and apparently determination was that he did not have osteomyelitis he did receive empiric antibiotics and was discharged on Keflex which he is completing per consultation in the hospital the best option was determined to be a high amputation-however apparently this was deferred for now apparently because of family desire  It appears in the hospital his white count varied from 14,000-to around 10,000 apparently was at the lower end on discharge.  Subsequent labs here have shown an elevated white count of 12-up to 14.5 thousand  He is a poor historian secondary to dementia but he is not complaining of any fever chills shortness of breath cough or dysuria-we did order a chest x-ray that has come back negative for anything acute urine also appears to be negative.  His PEG site also appears to be unremarkable with granulation tissue but I do not any sign of infection drainage bleeding or firmness around the site  He is followed by the wound care physician in the facility.  Family medical social history as been reviewed per previous progress notes.  Medications have been  reviewed.  Review of systems.  Limited secondary to dementia but as stated above he is not complaining of any fever or chills shortness of breath cough or dysuria does not complaining of abdominal pain has some complaints of lower extremity pain which are not increased from baseline  Physical exam.  Temperature is 97.1 pulse 85 respirations 16 blood pressure 107/63 O2 saturation 97% on room air  In general this is a frail elderly male in no distress lying comfortably in bed he is somewhat agitated with exam.  His skin is warm and dry I do note he has a gangrenous right great toe and left second toe this pretty much encompasses the entire toe. Heart is regular rate and rhythm without murmur gallop or rub.  His chest is clear to auscultation with no labored breathing.  Abdomen bowel sounds are positive -- where the PEG tube was removed there is granulation tissue I do not see any surrounding erythema drainage bleeding or  firmness around the site--there really is no significant increased tenderness when I palpate the area.  Labs.  03/17/2014.  WBC 14.5 hemoglobin 12.0 platelets 326 absolute granulocytes elevated at 11.3  Assessment and plan.  #1-leukocytosis-from clinical exam once would suspect this is coming mainly from the gangrenous toes-this has been discussed today with Dr. Leanord Hawking via phone--Will monitor.  I did speak with nursing staff and they have contacted the wound care physician who saw him today as well-apparently he is arranging a consult with the family about  care options-he has been made aware of the elevated white count  [id number]     .     .Marland Kitchen

## 2014-03-26 ENCOUNTER — Encounter: Payer: Self-pay | Admitting: Internal Medicine

## 2014-03-26 NOTE — Progress Notes (Signed)
Patient ID: Jack Ferguson, male   DOB: [birth year], 73 y.o.   MRN: [id number]   This is an acute visit.  Level of care skilled.  Facility [facility]  .  Chief complaint-acute visit status post hospitalization for malfunctioning PEG tube followup lower extremity gangrene.  History of present illness.  Patient is a 73 year old male with multiple medical issues-his PEG tube was malfunctioning and the facility he is status post CVA with dysphasia.  A PEG tube was assessed by the gastroenterologist and was discontinued he was reevaluated by speech therapy with a modified barium swallow-he seemed to be tolerating some by mouth intake.  Speech recommended mechanical soft diet.  The PEG tube was discontinued.  Apparently he tolerated the by mouth mechanical soft diet well in hospital.  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  Vascular surgeon also evaluated and concluded the same that a high amputation would be the best option.  Apparently this was discussed with his power of attorney his daughter.  Apparently it was determined this could be done at a later date and was deferred at time of hospitalization.  Infectious disease physician was also consulted for evaluation of the dry gangrene possible osteomyelitis and possible infection of the PEG site.  Patient initially was on broad-spectrum antibiotics-cultures from the PEG site did not reveal any specific organism. Patient was afebrile normotensive and leukocytosis apparently trended down.  Subsequently the IV antibiotics were discontinued and he was started on Keflex  And he has returned  to the facility.  He continues to be normotensive and afebrile it appears he continues to have a somewhat elevated white count as of today is 12.4-it had hovered as high as 14,000  previously  in facility--in the hospital it had been as high as 15,000 apparently on discharge was down to 10,000.   today patient has no acute complaints denies any fever chills does complain of pain at times apparently pain medication is helping  . Family medical social history as been reviewed most recently discharge summary on 03/20/2014  Medications have been reviewed.--Of note he does continue on Keflex  Review of systems.-Limited secondary to dementia.  Gen. is not complaining of fever chills.  Respiratory denies any cough or shortness of breath.  GI status post PEG tube which was removed he does not complaining of abdominal pain apparently he is tolerating his by mouth diet relatively well.  Muscle skeletal does have a history of dry gangrene and chronic pain which apparently is fairly well-controlled on current medications  Physical exam.  Temperature is 98.0 pulse 92 respirations 18 blood pressure 106/66.  In general this is a frail elderly male in no distress resting comfortably in bed however he is somewhat irritated with the exam.  Eyes pupils appear be reactive to light visual acuity appears grossly intact.  Chest is clear to auscultation no labored breathing.  Heart is regular rate and rhythm without murmur gallop or rub.  Abdomen is soft nontender with positive bowel sounds Peg site is covered this is followed by nursing and apparently looks fairly benign without sign of infection  Bowel sounds are positive.  Muscle skeletal-he does have contractures of the left lower extremity as well as the left arm--does have some movement of the arm.  He does have diffuse muscle atrophy.  Is able to move his right upper and lower  extremities.  He does have dry gangrene on the great toe on the right and left second toe this encompasses pretty much the entire toe.  I do not see any extensive erythema edema or drainage at this point  Labs.  Per hospital discharge  summary.  White count apparently was 10,000 on discharge hemoglobin 10.6 platelets 239.  Sodium 133 potassium 4 BUN 8 creatinine 0.56.  Assessment and plan.  #1 leukocytosis-this appears to vary between 10-15,000-patient had a fairly thorough valuation in hospital he is continuing on Keflex--will update a CBC with differential a.m. tomorrow and notify provider of results-there would be suspicion this is caused from the gangrene--- amputation  was discussed with family in the hospital per discharge note-he is also followed by the wound care physician in this facility and will be seen by him later this week--read  At this point appears to be clinically stable continue to monitor closely.  Also will obtain a urinalysis and culture as well as a chest x-ray to look for any other possible etiology  #2-diabetes-this appears to be diet-controlled CBG so far 99-109  #3 malfunctioning PEG tube--hx of dyshphagia-this has been removed apparently he is tolerating his by mouth diet fairly well continue to monitor.  #4-history CVA this appears to be at baseline.  #5-history of hypertension-this appears to be stable he is on Norvasc.  [id number]

## 2014-04-04 ENCOUNTER — Encounter: Payer: Self-pay | Admitting: Internal Medicine

## 2014-04-04 ENCOUNTER — Non-Acute Institutional Stay: Payer: Self-pay | Admitting: Internal Medicine

## 2014-04-04 DIAGNOSIS — I69354 Hemiplegia and hemiparesis following cerebral infarction affecting left non-dominant side: Secondary | ICD-10-CM

## 2014-04-04 DIAGNOSIS — R1310 Dysphagia, unspecified: Secondary | ICD-10-CM

## 2014-04-04 DIAGNOSIS — I96 Gangrene, not elsewhere classified: Secondary | ICD-10-CM

## 2014-04-04 DIAGNOSIS — I10 Essential (primary) hypertension: Secondary | ICD-10-CM

## 2014-04-04 NOTE — Assessment & Plan Note (Signed)
-  Continue Amlodipine daily

## 2014-04-04 NOTE — Progress Notes (Unsigned)
Patient ID: Jack Ferguson, male   DOB: [birth year], 73 y.o.   MRN: [id number]  Provider:  Gwenith Spitz. Renato Gails, D.O., C.M.D.  Location:  [facility]   PCP: No primary provider on file.  Code Status: FULL CODE  No Known Allergies  Chief Complaint  Patient presents with  . Hospitalization Follow-up    readmit s/p peg tube malfuction and discontinuation    HPI: 73 y.o. male with a history of severe PVD, dysphagia, hx of CVA, and DM II who is being readmitted to SNF from [facility].  Duration of hospitalization 03/18-03/23/2015.  He was admitted for PEG tube malfunctioning.  PEG tube was discontinued.  Speech therapy evaluated with barium swallow study.  Mechanical soft diet recommended and tolerated.  While in hospital Dr. Sondra Come with Vascular surgery and Dr. 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 Vascular states only treatment would be above knee amputation, that can be done at a later time.  Dr. Silvestre Mesi with Infectious diseases consulted as well.  Cultures of PEG site did not grow specific organisms; was treated with broad spectrum IV antibiotics which were transitioned to PO Keflex on discharge.  Palliative care consulted, spoke with daughter who is healthcare power of attorney; patient to still be full code. Plan is to follow-up with Dr. Sondra Come, Vascular surgery in 1-2 weeks; Gastroenterology in 1 week; Dr. Silvestre Mesi, Infectious diseases in 1-2 weeks; and Dr. Samuel Bouche, Ortho as needed.   ROS: ROS  Past Medical History  Diagnosis Date  . Gangrene of toe     right great toe and left 2nd toe  . Subdural hematoma, post-traumatic   . Hypertension   . PVD (peripheral vascular disease)   . Diabetes mellitus without complication   . Multiple falls   . Physical debility   . Constipation   . Stroke   . Hemiparesis affecting left side as late effect of cerebrovascular accident   . Fracture of left clavicle   .  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 Dementia    Past Surgical History  Procedure Laterality Date  . Cholecystectomy     Social History:   reports that he has never smoked. He does not have any smokeless tobacco history on file. His alcohol and drug histories are not on file.  Family History  Problem Relation Age of Onset  . Family history unknown: Yes    Medications: Patient's Medications  New Prescriptions   No medications on file  Previous Medications   ACETAMINOPHEN (TYLENOL) 500 MG TABLET    Take 500 mg by mouth 2 (two) times daily.   ALPRAZOLAM (XANAX) 0.25 MG TABLET    Take one tablet by mouth three times daily as needed for increased agitation   AMLODIPINE (NORVASC) 5 MG TABLET    10 mg by PEG Tube route daily.    BETA CAROTENE W/MINERALS (OCUVITE) TABLET    Take 1 tablet by mouth daily.   CALCIUM CARBONATE (TUMS - DOSED IN MG ELEMENTAL CALCIUM) 500 MG CHEWABLE TABLET    Chew 1 tablet by mouth daily.   CEPHALEXIN (KEFLEX) 500 MG CAPSULE    Take 500 mg by mouth 4 (four) times daily.   CHLORHEXIDINE (PERIDEX) 0.12 % SOLUTION    Use as directed 15 mLs in the mouth or throat 2 (two) times daily.   CLONIDINE (CATAPRES) 0.1 MG TABLET    Take 0.1 mg by mouth every 6 (six) hours as  needed. For SBP.170   FAMOTIDINE (PEPCID) 10 MG TABLET    10 mg by PEG Tube route 2 (two) times daily.   IBUPROFEN (ADVIL,MOTRIN) 600 MG TABLET    Take 600 mg by mouth every 8 (eight) hours as needed.   IPRATROPIUM-ALBUTEROL (DUONEB) 0.5-2.5 (3) MG/3ML SOLN    Take 3 mLs by nebulization every 6 (six) hours as needed.   LACTOBACILLUS ACIDOPHILUS (BACID) TABS TABLET    Take 2 tablets by mouth 2 (two) times daily.   OMEGA-3 FATTY ACIDS (FISH OIL) 1000 MG CAPS    Take 1,000 mg by mouth daily.   PHENOL-PHENOLATE SODIUM MT    Use as directed 1 spray in the mouth or throat daily.   POLYETHYLENE GLYCOL (MIRALAX / GLYCOLAX) PACKET    Take 17 g by mouth daily as needed.   VITAMIN C (ASCORBIC ACID) 500 MG TABLET    500  mg by PEG Tube route 2 (two) times daily.   ZINC SULFATE 220 MG CAPSULE    220 mg by Per J Tube route 2 (two) times daily.  Modified Medications   No medications on file  Discontinued Medications   No medications on file     Physical Exam: There were no vitals filed for this visit. Physical Exam   Labs reviewed: Urinalysis 03/23/2014: negative   CBC: 03/23/2014: WBC 14.5, Hgb 12, Hct 35.1, Plt 326 03/22/2014: WBC 12.4, Hgb 11.2, Hct 37.2, Plt 331 03/20/2014: WBC 10.1, Hgb 10.6, Hct 31.3, Plt 239  BMP: 03/23/2014: NA 134, K 3.5, BUN 13.9, Ca 8.5, Cr 0.5 03/20/2014: Na 133, K 4, BUN 8, Ca 7.8, Cr 0.56 03/19/2014: Na 126, K 3.3, BUN 6, Ca 8.1, Cr 0.45, AST 22, ALT 16, bili 0.6 02/10/2014: Na 132, K 4.5, BUN 16, Ca 8.8, Cr 0.4  Procalcitonin: 03/20/2014: <0.10  Wound culture: 03/16/2014: peg site: abundant WBC present-predominantly PNM. No squamous epithelial cells seen.  Rare gram positive cocci.   Blood culture: 03/16/2014: no growth  Lipase: 03/15/2014: 12  Imaging and Procedures:  CXR:  03/23/2014: normal.  No infiltrate, effucsion, or mass. No evidence of adenopathy or cardiomegaly. 03/15/2014: No acute pulmonary abnormality seen.   X-ray of left foot: 03/15/2014: Lytic destruction seen involving the proximal portion of the second proximal phalanx best seen on the lateral radiograph suggesting osteomyelitis. Vascular calcifications are noted in the soft tissues.  Minimal spurring of posterior calcaneus is noted.    X-ray of right foot: 03/15/2014: No radiographic evidence of osteomyelitis of right foot. If there is further clinical concern, recommend MRI or bone scan.   Abdominal ultrasound: 03/15/2014: Rounded loculated fluid appearing focus.  Differential considerations including small cysts and considering the patient's history a small abscess cannot be excluded.  There does not appear to be significant hyperemia associated with this finding would to be more  consistent with a small cyst.   ECG:  03/15/2014: Normal sinus rhythm. Ventrical rate 91, atrial rate 91, P-R interval 178, QRS duration 74, QT 346, P Axis 8, R Axis 0, T Axis 2, QTC calculation 425.    Assessment/Plan No problem-specific assessment & plan notes found for this encounter.   Functional status:  Family/ staff Communication:   Labs/tests ordered:  CBC and BMP with next lab draw

## 2014-04-04 NOTE — Assessment & Plan Note (Signed)
-  Wound doctor following

## 2014-04-04 NOTE — Assessment & Plan Note (Signed)
-   PT and OT

## 2014-04-04 NOTE — Assessment & Plan Note (Signed)
Mechanical soft diet

## 2014-04-12 ENCOUNTER — Non-Acute Institutional Stay (SKILLED_NURSING_FACILITY): Payer: Medicare Other | Admitting: Internal Medicine

## 2014-04-12 ENCOUNTER — Encounter: Payer: Self-pay | Admitting: Internal Medicine

## 2014-04-12 DIAGNOSIS — S065X9A Traumatic subdural hemorrhage with loss of consciousness of unspecified duration, initial encounter: Secondary | ICD-10-CM

## 2014-04-12 DIAGNOSIS — I96 Gangrene, not elsewhere classified: Secondary | ICD-10-CM

## 2014-04-12 DIAGNOSIS — R1310 Dysphagia, unspecified: Secondary | ICD-10-CM

## 2014-04-12 DIAGNOSIS — I69354 Hemiplegia and hemiparesis following cerebral infarction affecting left non-dominant side: Secondary | ICD-10-CM

## 2014-04-12 DIAGNOSIS — I6200 Nontraumatic subdural hemorrhage, unspecified: Secondary | ICD-10-CM

## 2014-04-12 DIAGNOSIS — S065XAA Traumatic subdural hemorrhage with loss of consciousness status unknown, initial encounter: Secondary | ICD-10-CM

## 2014-04-12 DIAGNOSIS — I10 Essential (primary) hypertension: Secondary | ICD-10-CM

## 2014-04-12 DIAGNOSIS — I69959 Hemiplegia and hemiparesis following unspecified cerebrovascular disease affecting unspecified side: Secondary | ICD-10-CM

## 2014-04-12 DIAGNOSIS — E1159 Type 2 diabetes mellitus with other circulatory complications: Secondary | ICD-10-CM

## 2014-04-12 NOTE — Progress Notes (Signed)
Patient ID: Jack Ferguson, male   DOB: 01/17/1941, 73 y.o.   MRN: [id number]    This is a discharge note.  Level of care skilled.  Facility[facility]  Chief Complaint    Discharge note        HPI: 73 y.o. male with a history of severe PVD, dysphagia, hx of CVA, and DM II who was recently  readmitted SNF from [facility].  Duration of hospitalization 03/18-03/23/2015.  He was admitted for PEG tube malfunctioning.  PEG tube was discontinued.  Speech therapy evaluated with barium swallow study.  Mechanical soft diet recommended and tolerated.  While in hospital Dr. Sondra Comeruz with Vascular surgery and Dr. 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 Vascular states only treatment would be above knee amputation, that can be done at a later time.  Dr. Silvestre MesiBhusal with Infectious diseases consulted as well.  Cultures of PEG site did not grow specific organisms; was treated with broad spectrum IV antibiotics which were transitioned to PO Keflex on discharge.  Palliative care consulted, spoke with daughter who is healthcare power of attorney; patient to still be full code. Plan is to follow-up with Dr. Sondra Comeruz, Vascular surgery Gastroenterology ; Dr. Silvestre MesiBhusal, Infectious diseases aswell as;  Dr. Samuel BoucheLucas, Ortho as needed  he has a previous history of subdural hematoma-CVA with left sided weakness.  Patient is slated to go home later this week apparently he has a very supportive daughter who is quite involved in his care and has cared for him before-he will need continued therapy as well as home health support for his numerous medical issues and significant deconditioning as well as wounds      Past Medical History   Diagnosis  Date   .  Gangrene of toe         right great toe and left 2nd toe   .  Subdural hematoma, post-traumatic     .  Hypertension     .  PVD (peripheral vascular disease)     .  Diabetes mellitus without  complication     .  Multiple falls     .  Physical debility     .  Constipation     .  Stroke     .  Hemiparesis affecting left side as late effect of cerebrovascular accident     .  Fracture of left clavicle     .  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  Dementia         Past Surgical History   Procedure  Laterality  Date   .  Cholecystectomy          Social History:   reports that he has never smoked. He does not have any smokeless tobacco history on file. His alcohol and drug histories are not on file.    Family History   Problem  Relation  Age of Onset   .  Family history unknown: Yes        Medications:      .     ALPRAZOLAM (XANAX) 0.25 MG TABLET     Take one tablet by mouth three times daily as needed for increased agitation     AMLODIPINE (NORVASC) 5 MG TABLET     10 mg by PEG Tube route daily.      BETA CAROTENE W/MINERALS (OCUVITE) TABLET     Take 1 tablet by mouth daily.  FAMOTIDINE (PEPCID) 10 MG TABLET     10 mg by PEG Tube route 2 (two) times daily.     IBUPROFEN (ADVIL,MOTRIN) 600 MG TABLET     Take 600 mg by mouth every 8 (eight) hours as needed.     IPRATROPIUM-ALBUTEROL (DUONEB) 0.5-2.5 (3) MG/3ML SOLN     Take 3 mLs by nebulization every 6 (six) hours as needed.     LACTOBACILLUS ACIDOPHILUS (BACID) TABS TABLET     Take 2 tablets by mouth 2 (two) times daily.           VITAMIN C (ASCORBIC ACID) 500 MG TABLET     500 mg by PEG Tube route 2 (two) times daily.         Review of systems.  Somewhat limited patient appears to be poor historian.  In general does not complain of fever chill  s. Respiratory no complaints of shortness of breath.  Cardiac does not complaining of chest pain.  Muscle skeletal does not complaining of joint pain at present   Physical Exam: Temperature 97.0 pulse 93 respirations 20 blood pressure 109/75  In general this is a frail appearing elderly male in no distress lying comfortably in bed.  His skin is warm  and dry.  He does have Dressing of his feet.and heels bilat  Eyes pupils appear reactive to light sclera and conjunctiva are clear.  Oropharynx clear mucous membranes appear fairly moist.  Chest is clear to auscultation with poor respiratory effort.  Heart is regular rate and rhythm with distant heart sounds I do not note any edema.  Abdomen is soft nondistended with positive bowel sounds has some mild diffuse tenderness to palpation which appears to be more agitation with the invasive maneuver of the exam.  Muscle skeletal general frailty with left-sided weakness which is baseline.  Psych does speak somewhat minimally was somewhat agitated with exam --however when asked he was looking forward to going home with his daughter          Labs reviewed  04/06/2014.  Sodium 138 potassium 3.4 BUN 25 creatinine 0.6.    WBC 11.8 hemoglobin 12.0 platelets 296  : Urinalysis 03/23/2014: negative    CBC: 03/23/2014: WBC 14.5, Hgb 12, Hct 35.1, Plt 326 03/22/2014: WBC 12.4, Hgb 11.2, Hct 37.2, Plt 331 03/20/2014: WBC 10.1, Hgb 10.6, Hct 31.3, Plt 239   BMP: 03/23/2014: NA 134, K 3.5, BUN 13.9, Ca 8.5, Cr 0.5 03/20/2014: Na 133, K 4, BUN 8, Ca 7.8, Cr 0.56 03/19/2014: Na 126, K 3.3, BUN 6, Ca 8.1, Cr 0.45, AST 22, ALT 16, bili 0.6 02/10/2014: Na 132, K 4.5, BUN 16, Ca 8.8, Cr 0.4   Procalcitonin: 03/20/2014: <0.10   Wound culture: 03/16/2014: peg site: abundant WBC present-predominantly PNM. No squamous epithelial cells seen.  Rare gram positive cocci.    Blood culture: 03/16/2014: no growth   Lipase: 03/15/2014: 12   Imaging and Procedures:   CXR:   03/23/2014: normal.  No infiltrate, effucsion, or mass. No evidence of adenopathy or cardiomegaly. 03/15/2014: No acute pulmonary abnormality seen.    X-ray of left foot: 03/15/2014: Lytic destruction seen involving the proximal portion of the second proximal phalanx best seen on the lateral radiograph  suggesting osteomyelitis. Vascular calcifications are noted in the soft tissues.  Minimal spurring of posterior calcaneus is noted.     X-ray of right foot: 03/15/2014: No radiographic evidence of osteomyelitis of right foot. If there is further clinical concern, recommend MRI or bone scan.  Abdominal ultrasound: 03/15/2014: Rounded loculated fluid appearing focus.  Differential considerations including small cysts and considering the patient's history a small abscess cannot be excluded.  There does not appear to be significant hyperemia associated with this finding would to be more consistent with a small cyst.    ECG:   03/15/2014: Normal sinus rhythm. Ventrical rate 91, atrial rate 91, P-R interval 178, QRS duration 74, QT 346, P Axis 8, R Axis 0, T Axis 2, QTC calculation 425.      Assessment/Plan        Hemiparesis affecting left side as late effect of cerebrovascular accident   He will need continued PT and OT AS WELL as CNA support-apparently his daughter is quite involved in his care--          Gangrene of toe -     -Wound doctor following in the facility- this will be monitored by nursing-home health-also has followup appointments with vascular surgery as well as infectious disease-Will write order to ensure these are arranged before discharge          Dysphagia, unspecified(787.20) -    -Mechanical soft diet --PEG  has been removed --apparently GI consult is pending Will write order to make sure this is followed up on        Essential hypertension, benign --this appears stable recent blood pressures 109/75-121/71-111/70  Diabetes type 2-this appears to be diet controlled CBGs are largely in the 90s-low 100s  Leukocytosis-this appears to have moderated an updated lab does not show any overt signs of infection will update this before discharge   On discharge patient will need extensive support including nursing and home health for followup of his medical issues  including wounds-also will need PT OT and CNA for strengthening and support as well as help with his ADLs.  Also will need a high-back wheelchair for ambulation again with his significant weakness.  [id number] note greater than 30 minutes spent on this discharge summary-greater than 50% of time spent coordinating plan of care

## 2014-04-28 DEATH — deceased
